# Patient Record
Sex: Female | Born: 1935 | Race: White | Hispanic: No | Smoking: Never smoker
Health system: Southern US, Community
[De-identification: ages and names within clinical notes are randomized; demographics above are authoritative.]

## PROBLEM LIST (undated history)

## (undated) DIAGNOSIS — I493 Ventricular premature depolarization: Secondary | ICD-10-CM

## (undated) DIAGNOSIS — I471 Supraventricular tachycardia: Secondary | ICD-10-CM

## (undated) DIAGNOSIS — I10 Essential (primary) hypertension: Secondary | ICD-10-CM

---

## 2016-03-18 ENCOUNTER — Emergency Department: Payer: Medicare Other

## 2016-03-18 ENCOUNTER — Ambulatory Visit (INDEPENDENT_AMBULATORY_CARE_PROVIDER_SITE_OTHER)
Admission: EM | Admit: 2016-03-18 | Discharge: 2016-03-18 | Disposition: A | Discharge status: Home / Self Care | Payer: Medicare Other | Source: Home / Self Care | Attending: Internal Medicine | Admitting: Internal Medicine

## 2016-03-18 ENCOUNTER — Observation Stay
Admission: EM | Admit: 2016-03-18 | Discharge: 2016-03-19 | Disposition: A | Discharge status: Home / Self Care | Payer: Medicare Other | Attending: Internal Medicine | Admitting: Internal Medicine

## 2016-03-18 DIAGNOSIS — Z7982 Long term (current) use of aspirin: Secondary | ICD-10-CM | POA: Diagnosis not present

## 2016-03-18 DIAGNOSIS — Z66 Do not resuscitate: Secondary | ICD-10-CM | POA: Diagnosis not present

## 2016-03-18 DIAGNOSIS — I10 Essential (primary) hypertension: Secondary | ICD-10-CM | POA: Diagnosis not present

## 2016-03-18 DIAGNOSIS — R0902 Hypoxemia: Secondary | ICD-10-CM

## 2016-03-18 DIAGNOSIS — R0602 Shortness of breath: Secondary | ICD-10-CM | POA: Diagnosis present

## 2016-03-18 DIAGNOSIS — I4891 Unspecified atrial fibrillation: Secondary | ICD-10-CM | POA: Diagnosis not present

## 2016-03-18 DIAGNOSIS — Z79899 Other long term (current) drug therapy: Secondary | ICD-10-CM | POA: Insufficient documentation

## 2016-03-18 HISTORY — DX: Supraventricular tachycardia: I47.1

## 2016-03-18 HISTORY — DX: Essential (primary) hypertension: I10

## 2016-03-18 HISTORY — DX: Ventricular premature depolarization: I49.3

## 2016-03-18 LAB — COMPREHENSIVE METABOLIC PANEL
ALK PHOS: 61 U/L (ref 38–126)
ALT: 12 U/L — AB (ref 14–54)
AST: 28 U/L (ref 15–41)
Albumin: 3.9 g/dL (ref 3.5–5.0)
Anion gap: 6 (ref 5–15)
BUN: 27 mg/dL — ABNORMAL HIGH (ref 6–20)
CALCIUM: 10 mg/dL (ref 8.9–10.3)
CO2: 28 mmol/L (ref 22–32)
CREATININE: 1.25 mg/dL — AB (ref 0.44–1.00)
Chloride: 104 mmol/L (ref 101–111)
GFR, EST AFRICAN AMERICAN: 40 mL/min — AB (ref >60)
GFR, EST NON AFRICAN AMERICAN: 35 mL/min — AB (ref >60)
Glucose, Bld: 93 mg/dL (ref 65–99)
Potassium: 4.9 mmol/L (ref 3.5–5.1)
Sodium: 138 mmol/L (ref 135–145)
Total Bilirubin: 1 mg/dL (ref 0.3–1.2)
Total Protein: 6.7 g/dL (ref 6.5–8.1)

## 2016-03-18 LAB — CBC
HEMATOCRIT: 41 % (ref 35.0–47.0)
HEMOGLOBIN: 13.9 g/dL (ref 12.0–16.0)
MCH: 30.4 pg (ref 26.0–34.0)
MCHC: 33.9 g/dL (ref 32.0–36.0)
MCV: 89.6 fL (ref 80.0–100.0)
Platelets: 205 10*3/uL (ref 150–440)
RBC: 4.57 MIL/uL (ref 3.80–5.20)
RDW: 13.3 % (ref 11.5–14.5)
WBC: 7.3 10*3/uL (ref 3.6–11.0)

## 2016-03-18 LAB — BRAIN NATRIURETIC PEPTIDE: B NATRIURETIC PEPTIDE 5: 160 pg/mL — AB (ref 0.0–100.0)

## 2016-03-18 LAB — TROPONIN I

## 2016-03-18 LAB — TSH: TSH: 0.953 u[IU]/mL (ref 0.350–4.500)

## 2016-03-18 MED ORDER — ONDANSETRON HCL 4 MG PO TABS: 4.0000 mg | ORAL_TABLET | Freq: Four times a day (QID) | ORAL | Status: DC | PRN

## 2016-03-18 MED ORDER — POLYETHYLENE GLYCOL 3350 17 G PO PACK: 17.0000 g | PACK | Freq: Every day | ORAL | Status: DC | PRN

## 2016-03-18 MED ORDER — ENOXAPARIN SODIUM 30 MG/0.3ML ~~LOC~~ SOLN
30.0000 mg | SUBCUTANEOUS | Status: DC
  Administered 2016-03-18: 30 mg via SUBCUTANEOUS
  Filled 2016-03-18: qty 0.3

## 2016-03-18 MED ORDER — DILTIAZEM HCL 25 MG/5ML IV SOLN: 10.0000 mg | Freq: Four times a day (QID) | INTRAVENOUS | Status: DC | PRN

## 2016-03-18 MED ORDER — ASPIRIN EC 81 MG PO TBEC
81.0000 mg | DELAYED_RELEASE_TABLET | Freq: Every day | ORAL | Status: DC
  Administered 2016-03-19: 81 mg via ORAL
  Filled 2016-03-18: qty 1

## 2016-03-18 MED ORDER — SODIUM CHLORIDE 0.9% FLUSH
3.0000 mL | Freq: Two times a day (BID) | INTRAVENOUS | Status: DC
  Administered 2016-03-19: 3 mL via INTRAVENOUS

## 2016-03-18 MED ORDER — IOPAMIDOL (ISOVUE-370) INJECTION 76%
60.0000 mL | Freq: Once | INTRAVENOUS | Status: AC | PRN
  Administered 2016-03-18: 60 mL via INTRAVENOUS

## 2016-03-18 MED ORDER — IPRATROPIUM-ALBUTEROL 0.5-2.5 (3) MG/3ML IN SOLN
3.0000 mL | Freq: Once | RESPIRATORY_TRACT | Status: AC
  Administered 2016-03-18: 3 mL via RESPIRATORY_TRACT
  Filled 2016-03-18: qty 3

## 2016-03-18 MED ORDER — ONDANSETRON HCL 4 MG/2ML IJ SOLN: 4.0000 mg | Freq: Four times a day (QID) | INTRAMUSCULAR | Status: DC | PRN

## 2016-03-18 MED ORDER — SODIUM CHLORIDE 0.9% FLUSH
3.0000 mL | Freq: Two times a day (BID) | INTRAVENOUS | Status: DC
  Administered 2016-03-18 – 2016-03-19 (×2): 3 mL via INTRAVENOUS

## 2016-03-18 MED ORDER — ACETAMINOPHEN 650 MG RE SUPP: 650.0000 mg | Freq: Four times a day (QID) | RECTAL | Status: DC | PRN

## 2016-03-18 MED ORDER — ACETAMINOPHEN 325 MG PO TABS: 650.0000 mg | ORAL_TABLET | Freq: Four times a day (QID) | ORAL | Status: DC | PRN

## 2016-03-18 MED ORDER — METOPROLOL TARTRATE 50 MG PO TABS
50.0000 mg | ORAL_TABLET | Freq: Two times a day (BID) | ORAL | Status: DC
  Administered 2016-03-18 – 2016-03-19 (×2): 50 mg via ORAL
  Filled 2016-03-18 (×2): qty 1

## 2016-03-18 MED ORDER — DILTIAZEM HCL 25 MG/5ML IV SOLN
10.0000 mg | Freq: Once | INTRAVENOUS | Status: AC
  Administered 2016-03-18: 10 mg via INTRAVENOUS
  Filled 2016-03-18: qty 5

## 2016-03-18 MED ORDER — LORAZEPAM 0.5 MG PO TABS: 0.5000 mg | ORAL_TABLET | Freq: Once | ORAL | Status: DC

## 2016-03-18 MED ORDER — SODIUM CHLORIDE 0.9% FLUSH: 3.0000 mL | INTRAVENOUS | Status: DC | PRN

## 2016-03-18 MED ORDER — SODIUM CHLORIDE 0.9 % IV SOLN: 250.0000 mL | INTRAVENOUS | Status: DC | PRN

## 2016-03-18 MED ORDER — FAMOTIDINE 20 MG PO TABS
20.0000 mg | ORAL_TABLET | Freq: Two times a day (BID) | ORAL | Status: DC
  Administered 2016-03-18 – 2016-03-19 (×2): 20 mg via ORAL
  Filled 2016-03-18 (×2): qty 1

## 2016-03-18 MED ORDER — ALBUTEROL SULFATE (2.5 MG/3ML) 0.083% IN NEBU: 2.5000 mg | INHALATION_SOLUTION | RESPIRATORY_TRACT | Status: DC | PRN

## 2016-03-18 NOTE — ED Notes (Signed)
Pt 02 turned from 4L [place] to 2L [place]

## 2016-03-18 NOTE — ED Notes (Signed)
Pt taken off 2L, on RA. Pt trembling, oral temp 97.9, EKG preformed, EDP at bedside

## 2016-03-18 NOTE — ED Notes (Signed)
Pt placed on 2L [place]

## 2016-03-18 NOTE — ED Triage Notes (Signed)
Pt bib EMS from urgent care w/ SOB.  Per EMS, pt woke up this AM w/ SOB and "shakey" feeling inside chest.  O2sat at urgent care mid 80s, 4L [place] administered.  Pt currently 95 % 4L [place].  Pt alert and oriented to self and situation, denies CP, n/v/d, or fever.  Pt reports dry cough that began today.

## 2016-03-18 NOTE — ED Provider Notes (Signed)
[facility] ____________________________________________  Time seen: Approximately 1240 PM  I have reviewed the triage vital signs and the nursing notes.   HISTORY  Chief Complaint Shortness of Breath  HPI Latasha Shaffer is a 82 y.o. female presenting with acute onset of the complaints of acute onset of shortness of breath. Patient's sister and daughter-in-law at bedside, and reporting patient has had a nonproductive cough for approximately 1 week. Reports patient woke up at 6 AM this morning with complaints of shortness of breath that has continued since. Denies history of similar in past. Denies any shortness of breath prior to this morning. Patient denies any pain or history of fever. Denies chest pain. Denies any paresthesias or unilateral weakness. Denies recent sickness or recent hospitalization.  PCP: Maryjane HurterFeldpausch Cardiology: Gwen PoundsKowalski  Medical history  HTN  There are no active problems to display for this patient.   No past surgical history on file.    No current facility-administered medications for this encounter.  No current outpatient prescriptions on file.  Allergies Patient has no known allergies.  No family history on file.  Social History Social History  Substance Use Topics  . Smoking status: Not on file  . Smokeless tobacco: Not on file  . Alcohol use Not on file    Review of Systems Constitutional: No fever/chills Eyes: No visual changes. ENT: No sore throat. Cardiovascular: Denies chest pain. Respiratory: Positive shortness of breath. Gastrointestinal: No abdominal pain.  No nausea, no vomiting.  No diarrhea.  No constipation. Genitourinary: Negative for dysuria. Musculoskeletal: Negative for back pain. Skin: Negative for rash. Neurological: Negative for focal weakness or numbness.  10-point ROS otherwise negative.  ____________________________________________   PHYSICAL EXAM:  VITAL SIGNS: ED Triage Vitals  Enc Vitals  Group     BP 03/18/16 1227 (!) 146/88     Pulse Rate 03/18/16 1227 64     Resp 03/18/16 1227 32     Temp --      Temp Source 03/18/16 1227 Oral     SpO2 03/18/16 1227 91 %     Weight --      Height --      Head Circumference --      Peak Flow --      Pain Score 03/18/16 1254 5     Pain Loc --      Pain Edu? --      Excl. in GC? --    Today's Vitals   03/18/16 1227 03/18/16 1229 03/18/16 1254  BP: (!) 146/88    Pulse: 64    Resp: 19    TempSrc: Oral    SpO2: 91% (!) 87%   PainSc:   5     Constitutional: Alert and oriented. Well appearing and in no acute distress. Eyes: Conjunctivae are normal. PERRL. EOMI. ENT      Head: Normocephalic and atraumatic.      Nose: No congestion/rhinnorhea.      Mouth/Throat: Mucous membranes are moist.  Cardiovascular: irregularly irregular. Grossly normal heart sounds.  Good peripheral circulation. Respiratory: Accessory muscle use. Patient speaking in 3-4 word sentences. No wheezes or rhonchi auscultated. Shallow breaths. No focal consolidation auscultated. Gastrointestinal: Soft and nontender. Normal Bowel sounds. Musculoskeletal:  Ambulatory with one person assistance. No lower extremity tenderness to palpation.  Neurologic:  Normal speech and language. Speech is normal. Skin:  Skin is warm, dry and intact. No rash noted. Psychiatric: Mood and affect are normal. Speech and behavior are normal. Patient exhibits appropriate insight and judgment  ___________________________________________   LABS (all labs ordered are listed, but only abnormal results are displayed)  Labs Reviewed - No data to display  RADIOLOGY  No results found.   ECG ED ECG REPORT I, Renford DillsLindsey Madisyn Mawhinney, the attending provider, personally viewed and interpreted this ECG.   Date: 03/18/2016  EKG Time: 1242  Rate: 87  Rhythm: abnormal ecg, possible a.fib. Concern with patient movement causing artifact.   Axis: normal  Intervals:none  ST&T Change: none  noted Artifact appears present, patient moving frequently during ecg with nurse ____________________________________________   PROCEDURES Procedures    INITIAL IMPRESSION / ASSESSMENT AND PLAN / ED COURSE  Pertinent labs & imaging results that were available during my care of the patient were reviewed by me and considered in my medical decision making (see chart for details).  82 year old past medical history of hypertension presented with acute onset of shortness of breath. Patient with appearance of respiratory distress noted to be initially on room air from 85-87%. Patient with accessory muscle use and shortness of breath. 4 L O2 nasal cannula applied and patient 93%. ECG concerning for a.fib, patient moving during ecg.No hx a.fib. Discussed in detail with patient and family recommend EMS transfer to emergency room for further evaluation of acute onset of shortness of breath. Patient and family verbalized understanding. EMS called and [facility] be [facility]. Judeth CornfieldStephanie Forensic scientistN charge nurse at [facility] called and given report.  Discussed follow up with Primary care physician this week. Discussed follow up and return parameters including no resolution or any worsening concerns. Patient verbalized understanding and agreed to plan.   ____________________________________________   FINAL CLINICAL IMPRESSION(S) / ED DIAGNOSES  Final diagnoses:  Shortness of breath  Hypoxia     There are no discharge medications for this patient.   Note: This dictation was prepared with Dragon dictation along with smaller phrase technology. Any transcriptional errors that result from this process are unintentional.    Clinical Course      Renford DillsLindsey Jhonnie Aliano, NP 03/18/16 2101

## 2016-03-18 NOTE — H&P (Signed)
[facility] [place] [facility]   PATIENT NAME: Latasha Shaffer    MR#:  [id number]  DATE OF BIRTH:  01-22-1917  DATE OF ADMISSION:  03/18/2016  PRIMARY CARE PHYSICIAN: Michaele Offeravid Kawasaki   REQUESTING/REFERRING PHYSICIAN: Dr. Lenard LancePaduchowski  CHIEF COMPLAINT:   Chief Complaint  Patient presents with  . Shortness of Breath    HISTORY OF PRESENT ILLNESS:  Latasha Shaffer  is a 82 y.o. female with a known history of Hypertension, SVT, GERD presents to the emergency room complaining of shortness of breath since morning. She also has palpitations. Found to have new onset atrial fibrillation. Initially patient was seen in urgent care and [facility]. Received 1 dose of IV Cardizem with some improvement in heart rate but heart rate is back into110-120 at this time. She has no chest pain or orthopnea. No wheezing. Chest x-ray shows no acute conditions other than chronic nodule and scoliosis.  PAST MEDICAL HISTORY:   Past Medical History:  Diagnosis Date  . Hypertension   . Hypertension   . PVC (premature ventricular contraction)   . SVT (supraventricular tachycardia) (HCC)    PAST SURGICAL HISTORY:  History reviewed. No pertinent surgical history.  SOCIAL HISTORY:   Social History  Substance Use Topics  . Smoking status: Never Smoker  . Smokeless tobacco: Never Used  . Alcohol use Not on file   FAMILY HISTORY:  No family history on file. Unknown DRUG ALLERGIES:  No Known Allergies  REVIEW OF SYSTEMS:   Review of Systems  Constitutional: Positive for malaise/fatigue. Negative for chills, fever and weight loss.  HENT: Negative for hearing loss and nosebleeds.   Eyes: Negative for blurred vision, double vision and pain.  Respiratory: Negative for cough, hemoptysis, sputum production, shortness of breath and wheezing.   Cardiovascular: Positive for palpitations. Negative for chest pain, orthopnea and leg swelling.  Gastrointestinal:  Negative for abdominal pain, constipation, diarrhea, nausea and vomiting.  Genitourinary: Negative for dysuria and hematuria.  Musculoskeletal: Negative for back pain, falls and myalgias.  Skin: Negative for rash.  Neurological: Positive for weakness. Negative for dizziness, tremors, sensory change, speech change, focal weakness, seizures and headaches.  Endo/Heme/Allergies: Does not bruise/bleed easily.  Psychiatric/Behavioral: Negative for depression and memory loss. The patient is not nervous/anxious.    MEDICATIONS AT HOME:   Prior to Admission medications   Medication Sig Start Date End Date Taking? Authorizing Provider  amLODipine (NORVASC) 5 MG tablet Take 1 tablet by mouth daily. 07/15/15  Yes Historical Provider, MD  aspirin EC 81 MG tablet Take 81 mg by mouth daily.   Yes Historical Provider, MD  Calcium-Vitamin D (CALTRATE 600 PLUS-VIT D PO) Take 1 tablet by mouth 2 (two) times daily.   Yes Historical Provider, MD  Cholecalciferol (VITAMIN D3) 2000 units capsule Take 2,000 Units by mouth daily.   Yes Historical Provider, MD  metoprolol succinate (TOPROL-XL) 25 MG 24 hr tablet Take 12.5 mg by mouth daily. 03/05/16  Yes Historical Provider, MD  ranitidine (ZANTAC) 150 MG tablet Take 1 tablet by mouth 2 (two) times daily.   Yes Historical Provider, MD  vitamin B-12 (CYANOCOBALAMIN) 1000 MCG tablet Take 1,000 mcg by mouth daily.   Yes Historical Provider, MD     VITAL SIGNS:  Blood pressure 130/72, pulse (!) 103, temperature 97.9 F (36.6 C), temperature source Oral, resp. rate 17, weight 43.1 kg (95 lb), SpO2 100 %.  PHYSICAL EXAMINATION:  Physical Exam  GENERAL:  82 y.o.-year-old patient lying in  the bed. Anxious EYES: Pupils equal, round, reactive to light and accommodation. No scleral icterus. Extraocular muscles intact.  HEENT: Head atraumatic, normocephalic. Oropharynx and nasopharynx clear. No oropharyngeal erythema, moist oral mucosa  NECK:  Supple, no jugular venous  distention. No thyroid enlargement, no tenderness.  LUNGS: Clear to auscultation bilaterally. Increased work of breathing. CARDIOVASCULAR: Irregularly irregular. Tachycardic. ABDOMEN: Soft, nontender, nondistended. Bowel sounds present. No organomegaly or mass.  EXTREMITIES: No pedal edema, cyanosis, or clubbing. + 2 pedal & radial pulses b/l.   NEUROLOGIC: Cranial nerves II through XII are intact. No focal Motor or sensory deficits appreciated b/l PSYCHIATRIC: The patient is alert and oriented x 3. Anxious SKIN: No obvious rash, lesion, or ulcer.   LABORATORY PANEL:   CBC  Recent Labs Lab 03/18/16 1341  WBC 7.3  HGB 13.9  HCT 41.0  PLT 205   ------------------------------------------------------------------------------------------------------------------  Chemistries   Recent Labs Lab 03/18/16 1341  NA 138  K 4.9  CL 104  CO2 28  GLUCOSE 93  BUN 27*  CREATININE 1.25*  CALCIUM 10.0  AST 28  ALT 12*  ALKPHOS 61  BILITOT 1.0   ------------------------------------------------------------------------------------------------------------------  Cardiac Enzymes  Recent Labs Lab 03/18/16 1341  TROPONINI <0.03   ------------------------------------------------------------------------------------------------------------------  RADIOLOGY:  Dg Chest 2 View  Result Date: 03/18/2016 CLINICAL DATA:  Chest pain and shortness of breath beginning today. EXAM: CHEST  2 VIEW COMPARISON:  None. FINDINGS: Heart size is at the upper limits of normal. There is aortic atherosclerosis. The lungs are clear. The vascularity is within normal limits. No effusions. Question 1 cm nodule right upper lobe, though there is quite a bit of overlying artifact. IMPRESSION: Scoliosis. Aortic atherosclerosis. Overlying artifact. No active disease identified. Question 1 cm nodule right upper lobe. Consider repeat radiography without overlying material when able. Electronically Signed   By: Paulina FusiMark  Shogry  M.D.   On: 03/18/2016 14:27     IMPRESSION AND PLAN:   * Shortness of breath Etiology is unclear. Patient continues to have shortness of breath in spite of improved heartrate. Chest x-ray shows nothing acute. We'll check a CT scan of the chest for pulmonary embolism.  * New onset Afib She has received 1 dose IV Cardizem. Heart rate close to 100 this time. We'll give her oral metoprolol 50 twice a day. She takes Toprol 0.5 mg twice a day at home. Continue aspirin. No anticoagulation. [facility]. Check TSH  * Hypertension Hold amlodipine. Increased dose of metoprolol.  * DVT prophylaxis with Lovenox   All the records are reviewed and case discussed with ED provider. Management plans discussed with the patient, family and they are in agreement.  CODE STATUS: DNR  TOTAL TIME TAKING CARE OF THIS PATIENT: 40 minutes.   Milagros LollSudini, Brant Peets R M.D on 03/18/2016 at 5:12 PM  Between 7am to 6pm - Pager - [phone]  After 6pm go to [email or web address] - password [email or web address] [facility]  [facility]  Office  [phone]  CC: Primary care physician; Michaele Offeravid Kawasaki  Note: This dictation was prepared with Dragon dictation along with smaller phrase technology. Any transcriptional errors that result from this process are unintentional.

## 2016-03-18 NOTE — ED Provider Notes (Signed)
[facility] Emergency Department Provider Note  Time seen: 1:37 PM  I have reviewed the triage vital signs and the nursing notes.   HISTORY  Chief Complaint Shortness of Breath    HPI Latasha Shaffer is a 82 y.o. female with no past medical history presents to the emergency department for shortness of breath. According to the patient she awoke this morning feeling short of breath. She went to urgent care and was noted to have an oxygen saturations in the 70s on room air. No home O2 requirement. The patient was transferred to the emergency department for further evaluation. Here the patient continues to be short of breath tachypnea and taking very small breaths. O2 saturation remains in the low 80s on room air. Patient denies any chest pain. Denies abdominal pain. Denies fever cough or congestion. Denies leg pain or swelling. States she has no medical history, takes no medications.  No past medical history on file.  There are no active problems to display for this patient.   History reviewed. No pertinent surgical history.  Prior to Admission medications   Not on File    No Known Allergies  No family history on file.  Social History Social History  Substance Use Topics  . Smoking status: Not on file  . Smokeless tobacco: Not on file  . Alcohol use Not on file    Review of Systems Constitutional: Negative for fever. Cardiovascular: Negative for chest pain. Respiratory: Negative for shortness of breath. Gastrointestinal: Negative for abdominal pain Genitourinary: Negative for dysuria. Neurological: Negative for headache 10-point ROS otherwise negative.  ____________________________________________   PHYSICAL EXAM:  VITAL SIGNS: ED Triage Vitals [03/18/16 1334]  Enc Vitals Group     BP (!) 143/86     Pulse Rate 75     Resp (!) 35     Temp 97.9 F (36.6 C)     Temp Source Oral     SpO2 95 %     Weight 95 lb (43.1 kg)     Height    Head Circumference      Peak Flow      Pain Score 0     Pain Loc      Pain Edu?      Excl. in GC?    Constitutional: Alert and oriented. Well appearing and in no distress. Eyes: Normal exam ENT   Head: Normocephalic and atraumatic.   Mouth/Throat: Mucous membranes are moist. Cardiovascular: Normal rate, regular rhythm. No murmur Respiratory: Moderate tachypnea in the mid 30s, short breaths, clear lung sounds, no wheeze. Gastrointestinal: Soft and nontender. No distention. Musculoskeletal: Nontender with normal range of motion in all extremities. No lower extremity edema or tenderness. Neurologic:  Normal speech and language. No gross focal neurologic deficits Skin:  Skin is warm, dry and intact.  Psychiatric: Mood and affect are normal.   ____________________________________________    EKG  EKG reviewed and interpreted by myself shows normal sinus rhythm at 85 bpm, narrow QRS, normal axis, normal intervals, no ST changes. Overall normal EKG, besides Frequent PACs.  ____________________________________________    RADIOLOGY  IMPRESSION: Scoliosis. Aortic atherosclerosis. Overlying artifact. No active disease identified. Question 1 cm nodule right upper lobe. Consider repeat radiography without overlying material when able.  ____________________________________________   INITIAL IMPRESSION / ASSESSMENT AND PLAN / ED COURSE  Pertinent labs & imaging results that were available during my care of the patient were reviewed by me and considered in my medical decision making (see chart for details).  Patient presents the emergency department with significant shortness of breath which began this morning. In the emergency department the patient is hypoxic, tachypneic.  Denies any complaints besides feeling short of breath. Patient satting in the mid to upper 90s on 4 L of oxygen. We will check labs, chest x-ray, close monitoring the emergency department.  Labs are largely  within normal limits. Troponin is negative. Chest x-ray shows no acute findings. We will dose a DuoNeb in the emergency department. At this time it is unclear what the patient's hypoxia is resulting from.  Patient is now trembling once again, repeat EKG consistent with atrial fibrillation with rapid ventricular response. This appears to be a new diagnosis for the patient. Given her dyspnea intermittent hypoxia and new onset atrial fibrillation with rapid ventricular response patient will be admitted to the hospital for further treatment. Patient given diltiazem in the emergency department, heart rate currently 95-105 bpm.  ____________________________________________   FINAL CLINICAL IMPRESSION(S) / ED DIAGNOSES  Dyspnea Atrial fibrillation with rapid ventricular response.   Minna AntisKevin Juaquina Machnik, MD 03/18/16 [phone]

## 2016-03-18 NOTE — ED Triage Notes (Addendum)
Patient complains of difficulty breathing with shortness of breath. Patient denies any fever or pain. Patient states that she has been shaky as well. Patient has been complaining of cough.

## 2016-03-18 NOTE — Progress Notes (Signed)
Anticoagulation monitoring(Lovenox):  82 yo female ordered Lovenox 40 mg Q24h  Filed Weights   03/18/16 1334 03/18/16 1855  Weight: 95 lb (43.1 kg) 109 lb 6.4 oz (49.6 kg)   BMI    Lab Results  Component Value Date   CREATININE 1.25 (H) 03/18/2016   Estimated Creatinine Clearance: 20.1 mL/min (by C-G formula based on SCr of 1.25 mg/dL (H)). Hemoglobin & Hematocrit     Component Value Date/Time   HGB 13.9 03/18/2016 1341   HCT 41.0 03/18/2016 1341     Per Protocol for Patient with estCrcl < 30 ml/min and BMI < 40, will transition to Lovenox 30 mg Q24h.

## 2016-03-19 LAB — CBC
HEMATOCRIT: 41.4 % (ref 35.0–47.0)
HEMOGLOBIN: 13.9 g/dL (ref 12.0–16.0)
MCH: 30.2 pg (ref 26.0–34.0)
MCHC: 33.5 g/dL (ref 32.0–36.0)
MCV: 90.1 fL (ref 80.0–100.0)
Platelets: 183 10*3/uL (ref 150–440)
RBC: 4.59 MIL/uL (ref 3.80–5.20)
RDW: 13.2 % (ref 11.5–14.5)
WBC: 3.8 10*3/uL (ref 3.6–11.0)

## 2016-03-19 LAB — BASIC METABOLIC PANEL
ANION GAP: 5 (ref 5–15)
BUN: 23 mg/dL — ABNORMAL HIGH (ref 6–20)
CHLORIDE: 105 mmol/L (ref 101–111)
CO2: 29 mmol/L (ref 22–32)
Calcium: 9.8 mg/dL (ref 8.9–10.3)
Creatinine, Ser: 1.19 mg/dL — ABNORMAL HIGH (ref 0.44–1.00)
GFR calc non Af Amer: 37 mL/min — ABNORMAL LOW (ref >60)
GFR, EST AFRICAN AMERICAN: 43 mL/min — AB (ref >60)
GLUCOSE: 90 mg/dL (ref 65–99)
Potassium: 4.3 mmol/L (ref 3.5–5.1)
Sodium: 139 mmol/L (ref 135–145)

## 2016-03-19 MED ORDER — METOPROLOL SUCCINATE ER 25 MG PO TB24: 25.0000 mg | ORAL_TABLET | Freq: Every day | ORAL | Status: DC

## 2016-03-19 MED ORDER — FAMOTIDINE 20 MG PO TABS: 20.0000 mg | ORAL_TABLET | Freq: Every day | ORAL | Status: DC

## 2016-03-19 MED ORDER — METOPROLOL SUCCINATE ER 25 MG PO TB24: 25.0000 mg | ORAL_TABLET | Freq: Every day | ORAL | 0 refills | Status: AC

## 2016-03-19 NOTE — Care Management Obs Status (Signed)
MEDICARE OBSERVATION STATUS NOTIFICATION   Patient Details  Name: Latasha Shaffer MRN: [id number] Date of Birth: [birth year]   Medicare Observation Status Notification Given:  Yes    Eber HongGreene, Dayanna Pryce R, RN 03/19/2016, 12:06 PM

## 2016-03-19 NOTE — Care Management (Signed)
Patient lives with her sister who "waits on me hand and foot."  No issues with transportation or accessing medical care.  Current with her PCP.  At present, care team members nor patient have identified any discharge needs.

## 2016-03-19 NOTE — Discharge Instructions (Signed)
Atrial Fibrillation °Introduction °Atrial fibrillation is a type of heartbeat that is irregular or fast (rapid). If you have this condition, your heart keeps quivering in a weird (chaotic) way. This condition can make it so your heart cannot pump blood normally. Having this condition gives a person more risk for stroke, heart failure, and other heart problems. There are different types of atrial fibrillation. Talk with your doctor to learn about the type that you have. °Follow these instructions at home: °· Take over-the-counter and prescription medicines only as told by your doctor. °· If your doctor prescribed a blood-thinning medicine, take it exactly as told. Taking too much of it can cause bleeding. If you do not take enough of it, you will not have the protection that you need against stroke and other problems. °· Do not use any tobacco products. These include cigarettes, chewing tobacco, and e-cigarettes. If you need help quitting, ask your doctor. °· If you have apnea (obstructive sleep apnea), manage it as told by your doctor. °· Do not drink alcohol. °· Do not drink beverages that have caffeine. These include coffee, soda, and tea. °· Maintain a healthy weight. Do not use diet pills unless your doctor says they are safe for you. Diet pills may make heart problems worse. °· Follow diet instructions as told by your doctor. °· Exercise regularly as told by your doctor. °· Keep all follow-up visits as told by your doctor. This is important. °Contact a doctor if: °· You notice a change in the speed, rhythm, or strength of your heartbeat. °· You are taking a blood-thinning medicine and you notice more bruising. °· You get tired more easily when you move or exercise. °Get help right away if: °· You have pain in your chest or your belly (abdomen). °· You have sweating or weakness. °· You feel sick to your stomach (nauseous). °· You notice blood in your throw up (vomit), poop (stool), or pee (urine). °· You are  short of breath. °· You suddenly have swollen feet and ankles. °· You feel dizzy. °· Your suddenly get weak or numb in your face, arms, or legs, especially if it happens on one side of your body. °· You have trouble talking, trouble understanding, or both. °· Your face or your eyelid droops on one side. °These symptoms may be an emergency. Do not wait to see if the symptoms will go away. Get medical help right away. Call your local emergency services (911 in the [place] Do not drive yourself to the hospital.  °This information is not intended to replace advice given to you by your health care provider. Make sure you discuss any questions you have with your health care provider. °Document Released: 12/17/2007 Document Revised: 08/15/2015 Document Reviewed: 07/04/2014 °© 2017 Elsevier ° °

## 2016-03-19 NOTE — Progress Notes (Signed)
Famotidine Renal adjustment  Famotidine renally adjusted from BID to daily based on CrCl.  Latasha Shaffer, PharmD

## 2016-03-19 NOTE — Consult Note (Addendum)
[facility]  CARDIOLOGY CONSULT NOTE  Patient ID: Latasha Shaffer MRN: [id number] DOB/AGE: Jul 09, 1916 82 y.o.  Admit date: 03/18/2016 Referring Physician Dr. Karlene LinemanV. Shah Primary Physician   Primary Cardiologist Dr. Gwen PoundsKowalski Reason for Consultation afib  HPI: Patient is a 82 year old female with history of hypertension, palpitations, questionable SVT although no previous atrial fibrillation who was admitted after developing chest heaviness and noted to be in atrial fibrillation with rapid ventricular response. She was given IV Cardizem 1 with some improvement. She takes Toprol-XL 12.5 mg daily at home along with enteric-coated aspirin and amlodipine at 5 mg daily. She lives at home with her 80 year old sister. They're both fairly functional. She has no history of frequent falls but is somewhat unsteady on her feet. Chest x-ray in the emergency room showed no acute process. Chest CT revealed no evidence of pulmonary embolism. She currently appears to be presently in sinus rhythm. There are frequent ectopic beats. She is hemodynamically stable.  Review of Systems  Constitutional: Negative.   HENT: Negative.   Eyes: Negative.   Respiratory: Negative.   Cardiovascular: Positive for chest pain and palpitations.  Gastrointestinal: Negative.   Genitourinary: Negative.   Musculoskeletal: Negative.   Skin: Negative.   Neurological: Negative.   Endo/Heme/Allergies: Negative.   Psychiatric/Behavioral: Negative.     Past Medical History:  Diagnosis Date  . Hypertension   . Hypertension   . PVC (premature ventricular contraction)   . SVT (supraventricular tachycardia) (HCC)     History reviewed. No pertinent family history.  Social History   Social History  . Marital status: Widowed    Spouse name: N/A  . Number of children: N/A  . Years of education: N/A   Occupational History  . Not on file.   Social History Main Topics  . Smoking status: Never  Smoker  . Smokeless tobacco: Never Used  . Alcohol use Not on file  . Drug use: Unknown  . Sexual activity: Not on file   Other Topics Concern  . Not on file   Social History Narrative  . No narrative on file    History reviewed. No pertinent surgical history.   Prescriptions Prior to Admission  Medication Sig Dispense Refill Last Dose  . amLODipine (NORVASC) 5 MG tablet Take 1 tablet by mouth daily.   03/18/2016 at 0800  . aspirin EC 81 MG tablet Take 81 mg by mouth daily.   03/18/2016 at 0800  . Calcium-Vitamin D (CALTRATE 600 PLUS-VIT D PO) Take 1 tablet by mouth 2 (two) times daily.   03/18/2016 at 0800  . Cholecalciferol (VITAMIN D3) 2000 units capsule Take 2,000 Units by mouth daily.   03/18/2016 at 0800  . ranitidine (ZANTAC) 150 MG tablet Take 1 tablet by mouth 2 (two) times daily.   unknown at unknown  . vitamin B-12 (CYANOCOBALAMIN) 1000 MCG tablet Take 1,000 mcg by mouth daily.   03/18/2016 at 0800  . [DISCONTINUED] metoprolol succinate (TOPROL-XL) 25 MG 24 hr tablet Take 12.5 mg by mouth daily.  4 03/18/2016 at 0800    Physical Exam: Blood pressure (!) 152/88, pulse 76, temperature 97.8 F (36.6 C), temperature source Oral, resp. rate 18, height 5\' 2"  (1.575 m), weight 108 lb 4.8 oz (49.1 kg), SpO2 98 %.   Wt Readings from Last 1 Encounters:  03/19/16 108 lb 4.8 oz (49.1 kg)     General appearance: alert and cooperative Head: Normocephalic, without obvious abnormality, atraumatic Resp: clear to auscultation bilaterally Cardio: regular  rate and rhythm GI: soft, non-tender; bowel sounds normal; no masses,  no organomegaly Extremities: extremities normal, atraumatic, no cyanosis or edema Neurologic: Grossly normal  Labs:   Lab Results  Component Value Date   WBC 3.8 03/19/2016   HGB 13.9 03/19/2016   HCT 41.4 03/19/2016   MCV 90.1 03/19/2016   PLT 183 03/19/2016    Recent Labs Lab 03/18/16 1341 03/19/16 0531  NA 138 139  K 4.9 4.3  CL 104 105  CO2 28  29  BUN 27* 23*  CREATININE 1.25* 1.19*  CALCIUM 10.0 9.8  PROT 6.7  --   BILITOT 1.0  --   ALKPHOS 61  --   ALT 12*  --   AST 28  --   GLUCOSE 93 90   Lab Results  Component Value Date   TROPONINI <0.03 03/18/2016      Radiology: No acute cardiopulmonary disease EKG: Initially atrial fibrillation rapid ventricular response. Currently in sinus rhythm.  ASSESSMENT AND PLAN:  Patient with history of palpitations who was admitted with atrial fibrillation rapid ventricular response. She is ruled out for myocardial infarction. Rate was improved with IV Cardizem and improved with increased metoprolol. We'll continue with metoprolol succinate 25 mg daily and discontinued amlodipine due to the increased metoprolol dose. Will defer chronic anticoagulation and remain on aspirin alone due to fall and bleeding risk. Okay for discharge from a cardiac standpoint. We'll suggest follow-up with Dr. Gwen PoundsKowalski in 5-7 days for further outpatient workup. Signed: Dalia HeadingKenneth A Elisama Thissen MD, [facility] 03/19/2016, 1:30 PM

## 2016-03-20 NOTE — Discharge Summary (Signed)
[facility] [place] [facility]   PATIENT NAME: Latasha SenegalMarie Vinas    MR#:  [id number]  DATE OF BIRTH:  [birth year]  DATE OF ADMISSION:  03/18/2016   ADMITTING PHYSICIAN: Milagros LollSrikar Sudini, MD  DATE OF DISCHARGE: 03/19/2016  4:03 PM  PRIMARY CARE PHYSICIAN: FELDPAUSCH, DALE E, MD   ADMISSION DIAGNOSIS:  SOB (shortness of breath) [R06.02] Atrial fibrillation with rapid ventricular response (HCC) [I48.91] DISCHARGE DIAGNOSIS:  Active Problems:   A-fib (HCC)  SECONDARY DIAGNOSIS:   Past Medical History:  Diagnosis Date  . Hypertension   . Hypertension   . PVC (premature ventricular contraction)   . SVT (supraventricular tachycardia) [facility](HCC)    HOSPITAL COURSE:  82 year old female with history of hypertension, palpitations, questionable SVT although no previous atrial fibrillation who was admitted after developing chest heaviness and noted to be in atrial fibrillation with rapid ventricular response  * Shortness of breath: overall improved after converting to NSR. Etiology is unclear. Patient continues to have shortness of breath in spite of improved heartrate. Chest x-ray shows nothing acute. CT scan of the chest neg for pulmonary embolism.  * New onset Afib - ruled out for myocardial infarction. Rate was improved with IV Cardizem and improved with increased metoprolol. continue with metoprolol succinate 25 mg daily and discontinued amlodipine due to the increased metoprolol dose. Will defer chronic anticoagulation and remain on aspirin alone due to fall and bleeding risk.  - Outpt follow-up with Dr. Gwen PoundsKowalski in 5-7 days for further outpatient workup.  * Hypertension stop amlodipine. Increased dose of metoprolol.  DISCHARGE CONDITIONS:  STABLE CONSULTS OBTAINED:  Treatment Team:  Dalia HeadingKenneth A Fath, MD DRUG ALLERGIES:  No Known Allergies DISCHARGE MEDICATIONS:   Allergies as of 03/19/2016   No Known Allergies     Medication List    TAKE these  medications   amLODipine 5 MG tablet Commonly known as:  NORVASC Take 1 tablet by mouth daily.   aspirin EC 81 MG tablet Take 81 mg by mouth daily.   CALTRATE 600 PLUS-VIT D PO Take 1 tablet by mouth 2 (two) times daily.   metoprolol succinate 25 MG 24 hr tablet Commonly known as:  TOPROL-XL Take 1 tablet (25 mg total) by mouth daily. What changed:  how much to take   ranitidine 150 MG tablet Commonly known as:  ZANTAC Take 1 tablet by mouth 2 (two) times daily.   vitamin B-12 1000 MCG tablet Commonly known as:  CYANOCOBALAMIN Take 1,000 mcg by mouth daily.   Vitamin D3 2000 units capsule Take 2,000 Units by mouth daily.        DISCHARGE INSTRUCTIONS:   DIET:  Regular diet DISCHARGE CONDITION:  Good ACTIVITY:  Activity as tolerated OXYGEN:  Home Oxygen: No.  Oxygen Delivery: room air DISCHARGE LOCATION:  home   If you experience worsening of your admission symptoms, develop shortness of breath, life threatening emergency, suicidal or homicidal thoughts you must seek medical attention immediately by calling 911 or calling your MD immediately  if symptoms less severe.  You Must read complete instructions/literature along with all the possible adverse reactions/side effects for all the Medicines you take and that have been prescribed to you. Take any new Medicines after you have completely understood and accpet all the possible adverse reactions/side effects.   Please note  You were cared for by a hospitalist during your hospital stay. If you have any questions about your discharge medications or the care you received while you were in the  hospital after you are discharged, you can call the unit and asked to speak with the hospitalist on call if the hospitalist that took care of you is not available. Once you are discharged, your primary care physician will handle any further medical issues. Please note that NO REFILLS for any discharge medications will be authorized  once you are discharged, as it is imperative that you return to your primary care physician (or establish a relationship with a primary care physician if you do not have one) for your aftercare needs so that they can reassess your need for medications and monitor your lab values.    On the day of Discharge:  VITAL SIGNS:  Blood pressure (!) 152/88, pulse 76, temperature 97.8 F (36.6 C), temperature source Oral, resp. rate 18, height 5\' 2"  (1.575 m), weight 49.1 kg (108 lb 4.8 oz), SpO2 98 %. PHYSICAL EXAMINATION:  GENERAL:  82 y.o.-year-old patient lying in the bed with no acute distress.  EYES: Pupils equal, round, reactive to light and accommodation. No scleral icterus. Extraocular muscles intact.  HEENT: Head atraumatic, normocephalic. Oropharynx and nasopharynx clear.  NECK:  Supple, no jugular venous distention. No thyroid enlargement, no tenderness.  LUNGS: Normal breath sounds bilaterally, no wheezing, rales,rhonchi or crepitation. No use of accessory muscles of respiration.  CARDIOVASCULAR: S1, S2 normal. No murmurs, rubs, or gallops.  ABDOMEN: Soft, non-tender, non-distended. Bowel sounds present. No organomegaly or mass.  EXTREMITIES: No pedal edema, cyanosis, or clubbing.  NEUROLOGIC: Cranial nerves II through XII are intact. Muscle strength 5/5 in all extremities. Sensation intact. Gait not checked.  PSYCHIATRIC: The patient is alert and oriented x 3.  SKIN: No obvious rash, lesion, or ulcer.  DATA REVIEW:   CBC  Recent Labs Lab 03/19/16 0531  WBC 3.8  HGB 13.9  HCT 41.4  PLT 183    Chemistries   Recent Labs Lab 03/18/16 1341 03/19/16 0531  NA 138 139  K 4.9 4.3  CL 104 105  CO2 28 29  GLUCOSE 93 90  BUN 27* 23*  CREATININE 1.25* 1.19*  CALCIUM 10.0 9.8  AST 28  --   ALT 12*  --   ALKPHOS 61  --   BILITOT 1.0  --     Follow-up Information    [facility]. Go on 03/30/2016.   Specialty:  Family Medicine Why:  Appointment Time:3:00PM Contact  information: [address] [facility] [place] [address] [phone]        Lamar BlinksBruce J Kowalski, MD. Go on 03/26/2016.   Specialty:  Cardiology Why:  Appointment Time:3:30PM Contact information: [address] [facility] [place] [address] [phone]           Management plans discussed with the patient, family and they are in agreement.  CODE STATUS: DNR  TOTAL TIME TAKING CARE OF THIS PATIENT: 45 minutes.    Delfino LovettVipul Abiha Lukehart M.D on 03/20/2016 at 10:40 PM  Between 7am to 6pm - Pager - [phone]  After 6pm go to [email or web address] - Social research officer, [email or web address] [email or web address]  [facility]  Office  [phone]  CC: Primary care physician; [facility] LPFELDPAUSCH, Madaline GuthrieALE E, MD   Note: This dictation was prepared with Dragon dictation along with smaller phrase technology. Any transcriptional errors that result from this process are unintentional.

## 2018-02-07 IMAGING — CT CT ANGIO CHEST
2 of 6 series · 19 of 46 positions shown · IV contrast (APPLIED)
Comparison: None.

CLINICAL DATA: Shortness of breath.

EXAM:
CT ANGIOGRAPHY CHEST WITH CONTRAST
TECHNIQUE: Multidetector CT imaging of the chest was performed using the
standard protocol during bolus administration of intravenous
contrast. Multiplanar CT image reconstructions and MIPs were
obtained to evaluate the vascular anatomy.
CONTRAST:  60 cc Isovue 370

[Series 6: thins · axial · 0.64mm/px · z∈[-529,-276]mm · 17 of 279 slices shown]
[im 13/279  lung]
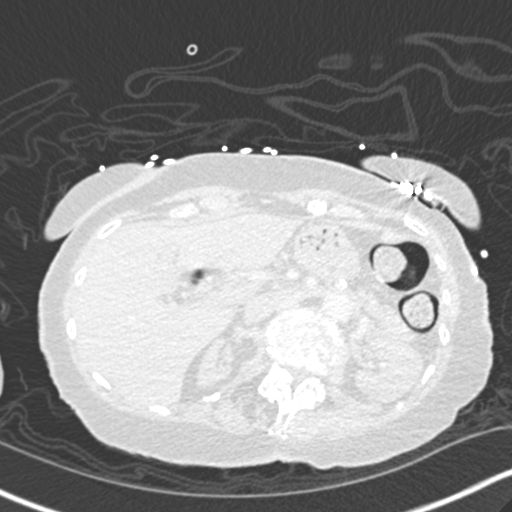
[im 25/279  soft-tissue]
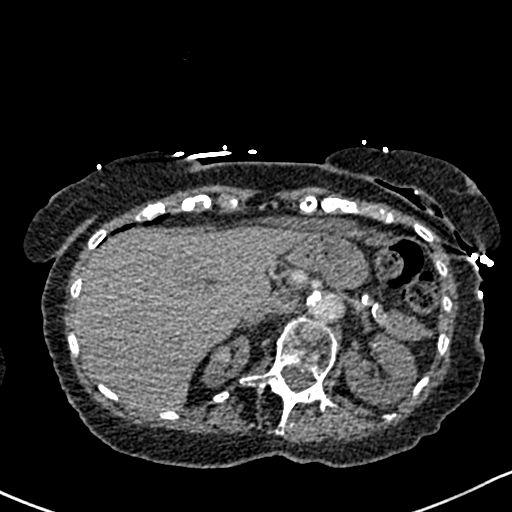
[im 49/279  lung]
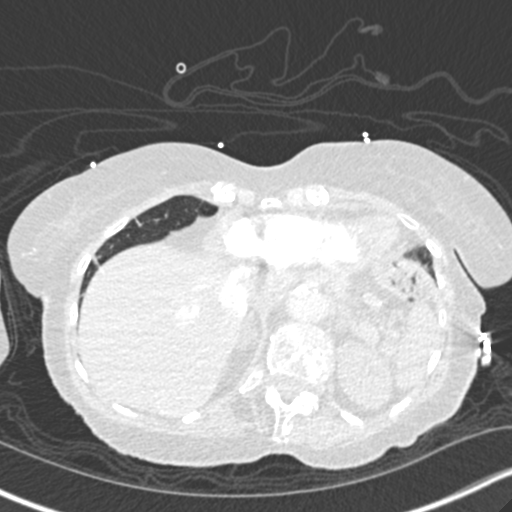
[im 61/279  soft-tissue]
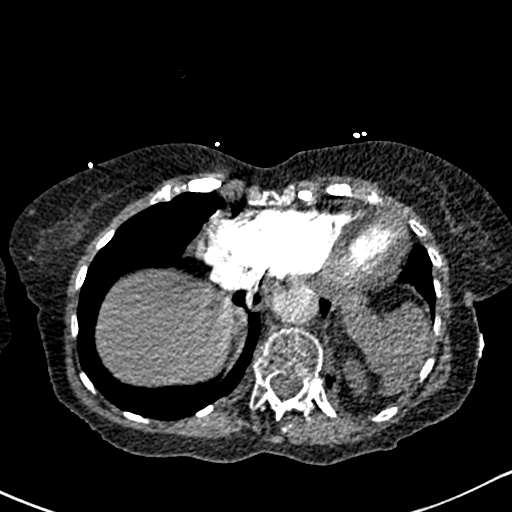
[im 73/279  lung]
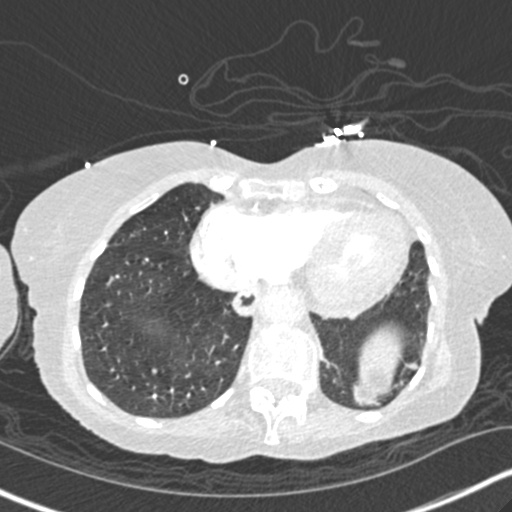
[im 97/279  soft-tissue]
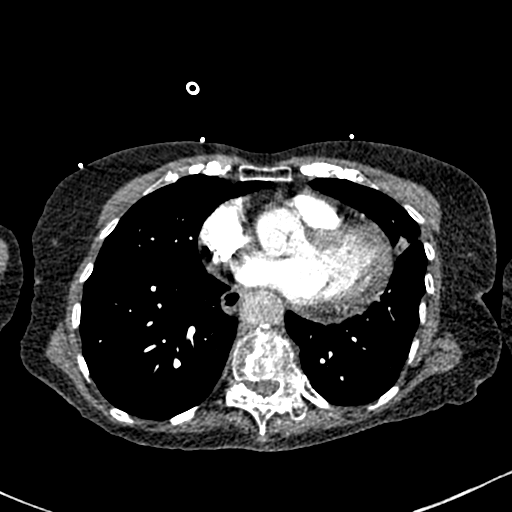
[im 109/279  lung]
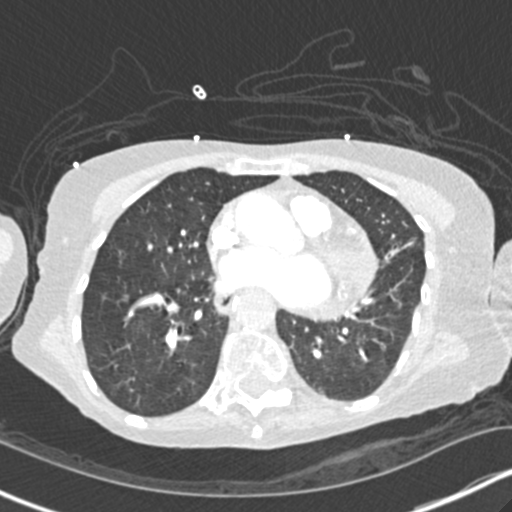
[im 121/279  soft-tissue]
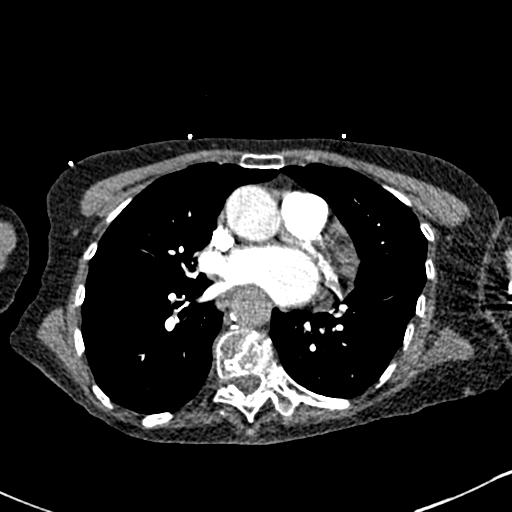
[im 146/279  lung]
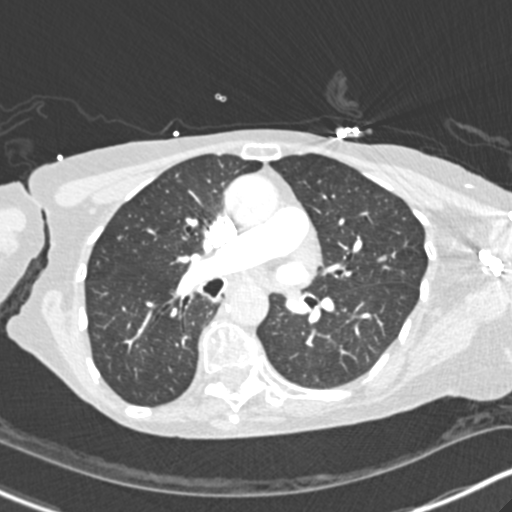
[im 158/279  soft-tissue]
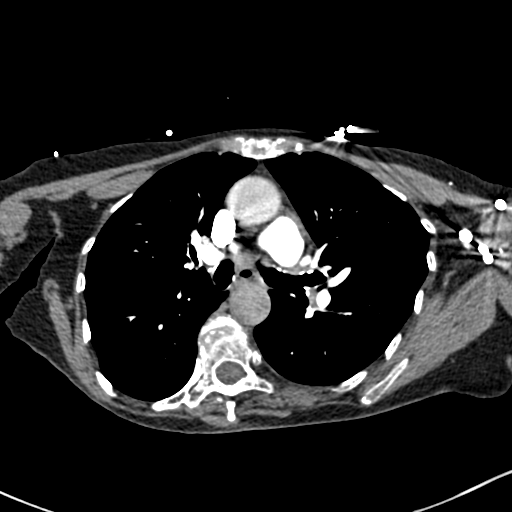
[im 170/279  lung]
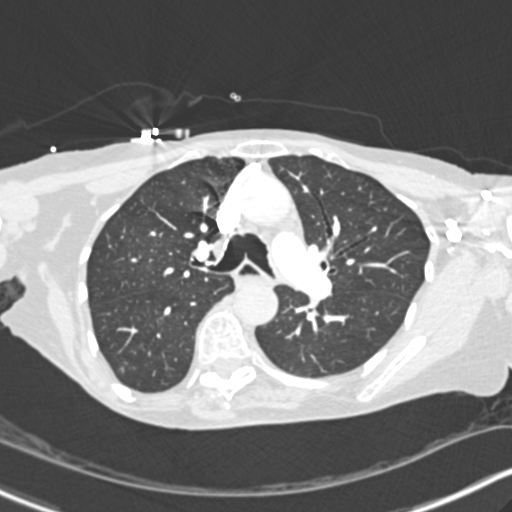
[im 182/279  soft-tissue]
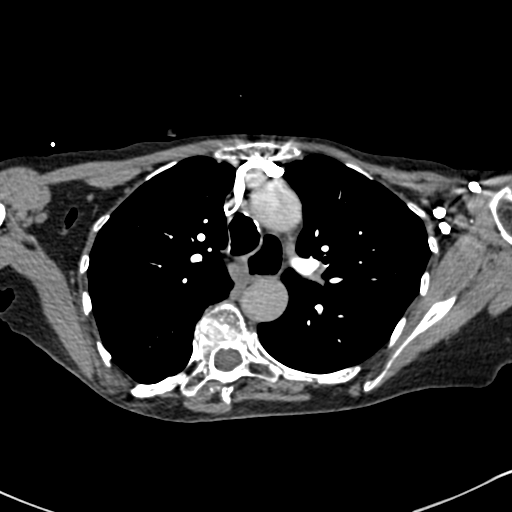
[im 206/279  lung]
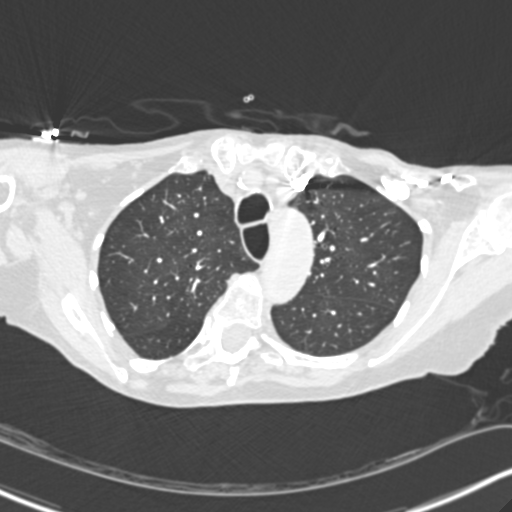
[im 218/279  soft-tissue]
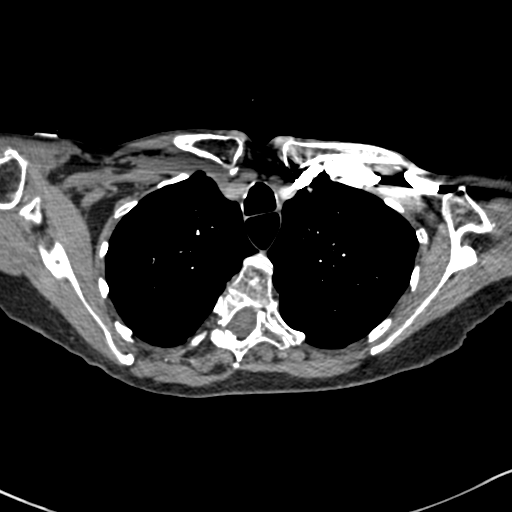
[im 230/279  lung]
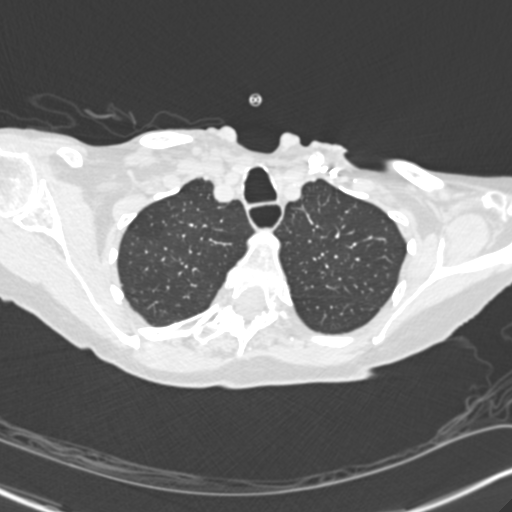
[im 254/279  soft-tissue]
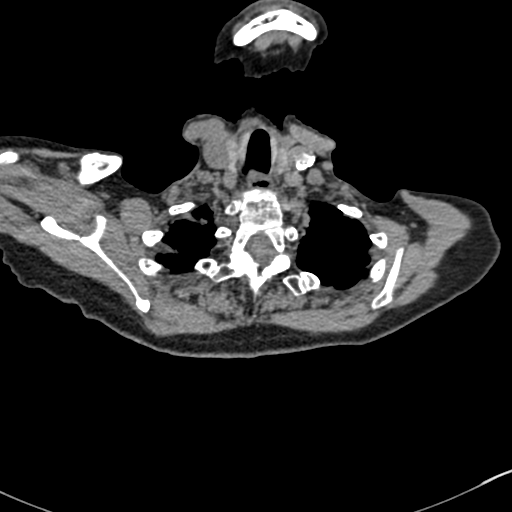
[im 266/279  lung]
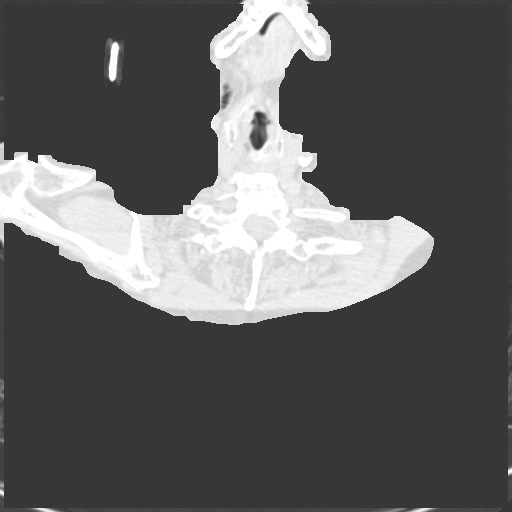

[Series 8: coronal mpr · coronal · 0.55mm/px · 2 of 67 slices shown]
[im 23/67  soft-tissue]
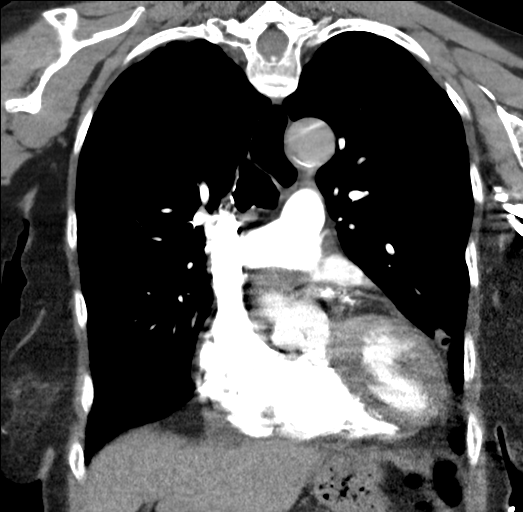
[im 45/67  soft-tissue]
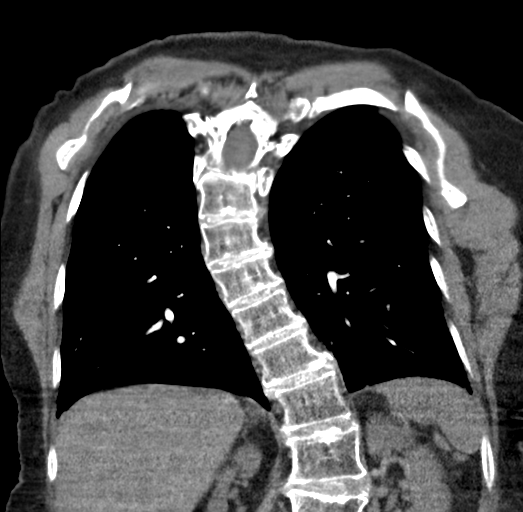

[19 of 46 positions shown; findings below may reference images not displayed]

FINDINGS: Cardiovascular: Heart size upper normal. No pericardial effusion.
Coronary artery calcification is noted. Atherosclerotic
calcification is noted in the wall of the thoracic aorta. No filling
defects in the opacified pulmonary arteries to suggest the presence
of an acute pulmonary embolus.

Mediastinum/Nodes: No mediastinal lymphadenopathy. There is no hilar
lymphadenopathy. Esophagus is slightly patulous, potentially from
dysmotility. There is no axillary lymphadenopathy.

Lungs/Pleura: Bronchiectasis and scarring noted posterior lingula no
suspicious pulmonary nodule or mass. No focal airspace consolidation
no pulmonary edema or pleural effusion.

Upper Abdomen: Right kidney is atrophic. 10 mm hyperattenuating
lesion is identified in the upper pole the right kidney (Image 84
series 5).

Musculoskeletal: Bones are diffusely demineralized. Thoracolumbar
scoliosis is evident.

Review of the MIP images confirms the above findings.
IMPRESSION: 1. No CT evidence for acute pulmonary embolus.
2. No CT findings to explain the patient's history of shortness of
breath.

## 2018-03-30 ENCOUNTER — Emergency Department
Admission: EM | Admit: 2018-03-30 | Discharge: 2018-03-30 | Disposition: A | Discharge status: Home / Self Care | Payer: Medicare Other | Attending: Emergency Medicine | Admitting: Emergency Medicine

## 2018-03-30 ENCOUNTER — Encounter: Payer: Self-pay | Admitting: Emergency Medicine

## 2018-03-30 ENCOUNTER — Emergency Department: Payer: Medicare Other

## 2018-03-30 ENCOUNTER — Other Ambulatory Visit: Payer: Self-pay

## 2018-03-30 DIAGNOSIS — R531 Weakness: Secondary | ICD-10-CM | POA: Insufficient documentation

## 2018-03-30 DIAGNOSIS — Z5321 Procedure and treatment not carried out due to patient leaving prior to being seen by health care provider: Secondary | ICD-10-CM | POA: Diagnosis not present

## 2018-03-30 LAB — BASIC METABOLIC PANEL
Anion gap: 10 (ref 5–15)
BUN: 22 mg/dL (ref 8–23)
CALCIUM: 9.5 mg/dL (ref 8.9–10.3)
CO2: 24 mmol/L (ref 22–32)
Chloride: 103 mmol/L (ref 98–111)
Creatinine, Ser: 1.07 mg/dL — ABNORMAL HIGH (ref 0.44–1.00)
GFR calc Af Amer: 50 mL/min — ABNORMAL LOW (ref >60)
GFR, EST NON AFRICAN AMERICAN: 43 mL/min — AB (ref >60)
Glucose, Bld: 141 mg/dL — ABNORMAL HIGH (ref 70–99)
Potassium: 4.5 mmol/L (ref 3.5–5.1)
Sodium: 137 mmol/L (ref 135–145)

## 2018-03-30 LAB — CBC
HCT: 41.7 % (ref 36.0–46.0)
Hemoglobin: 13.3 g/dL (ref 12.0–15.0)
MCH: 30.1 pg (ref 26.0–34.0)
MCHC: 31.9 g/dL (ref 30.0–36.0)
MCV: 94.3 fL (ref 80.0–100.0)
Platelets: 184 10*3/uL (ref 150–400)
RBC: 4.42 MIL/uL (ref 3.87–5.11)
RDW: 12 % (ref 11.5–15.5)
WBC: 9.2 10*3/uL (ref 4.0–10.5)
nRBC: 0 % (ref 0.0–0.2)

## 2018-03-30 NOTE — ED Triage Notes (Signed)
Patient presents to the ED post fall that occurred yesterday.  Patient is complaining of some lower back pain but family states patient has been lying in bed all day not wanting to eat or walk which is unusual for her.  Patient is unsure of how she fell.  Patient states, "my head doesn't hurt."  Patient is tachypnic and appears pale.  Multiple small skin tears to her arms post fall.  Patient lives with her 83 year old sister.

## 2018-04-01 ENCOUNTER — Telehealth: Payer: Self-pay | Admitting: Emergency Medicine

## 2018-04-01 NOTE — Telephone Encounter (Signed)
Called patient due to lwot to inquire about condition and follow up plans. Left message.   

## 2018-08-22 DEATH — deceased
